# Patient Record
Sex: Male | Born: 1960 | Race: White | Hispanic: No | Marital: Single | Smoking: Former smoker
Health system: Southern US, Community
[De-identification: ages and names within clinical notes are randomized; demographics above are authoritative.]

## PROBLEM LIST (undated history)

## (undated) DIAGNOSIS — K274 Chronic or unspecified peptic ulcer, site unspecified, with hemorrhage: Secondary | ICD-10-CM

## (undated) DIAGNOSIS — Z972 Presence of dental prosthetic device (complete) (partial): Secondary | ICD-10-CM

## (undated) DIAGNOSIS — S129XXA Fracture of neck, unspecified, initial encounter: Secondary | ICD-10-CM

## (undated) HISTORY — PX: ESOPHAGOGASTRODUODENOSCOPY: SHX1529

---

## 2001-03-28 DIAGNOSIS — S129XXA Fracture of neck, unspecified, initial encounter: Secondary | ICD-10-CM

## 2001-03-28 HISTORY — DX: Fracture of neck, unspecified, initial encounter: S12.9XXA

## 2007-03-29 DIAGNOSIS — K274 Chronic or unspecified peptic ulcer, site unspecified, with hemorrhage: Secondary | ICD-10-CM

## 2007-03-29 DIAGNOSIS — K284 Chronic or unspecified gastrojejunal ulcer with hemorrhage: Secondary | ICD-10-CM

## 2007-03-29 HISTORY — DX: Chronic or unspecified peptic ulcer, site unspecified, with hemorrhage: K27.4

## 2007-03-29 HISTORY — DX: Chronic or unspecified gastrojejunal ulcer with hemorrhage: K28.4

## 2015-07-16 ENCOUNTER — Other Ambulatory Visit: Payer: Self-pay | Admitting: Nurse Practitioner

## 2015-07-16 DIAGNOSIS — R1032 Left lower quadrant pain: Secondary | ICD-10-CM

## 2015-07-16 DIAGNOSIS — R945 Abnormal results of liver function studies: Secondary | ICD-10-CM

## 2015-07-17 ENCOUNTER — Ambulatory Visit
Admission: RE | Admit: 2015-07-17 | Discharge: 2015-07-17 | Disposition: A | Discharge status: Home / Self Care | Payer: PRIVATE HEALTH INSURANCE | Source: Ambulatory Visit | Attending: Nurse Practitioner | Admitting: Nurse Practitioner

## 2015-07-17 ENCOUNTER — Encounter: Payer: Self-pay | Admitting: *Deleted

## 2015-07-17 DIAGNOSIS — R1032 Left lower quadrant pain: Secondary | ICD-10-CM

## 2015-07-17 DIAGNOSIS — K7689 Other specified diseases of liver: Secondary | ICD-10-CM | POA: Insufficient documentation

## 2015-07-17 DIAGNOSIS — R945 Abnormal results of liver function studies: Secondary | ICD-10-CM

## 2015-07-17 MED ORDER — IOHEXOL 350 MG/ML SOLN
125.0000 mL | Freq: Once | INTRAVENOUS | Status: AC | PRN
Start: 2015-07-17 — End: 2015-07-17
  Administered 2015-07-17: 125 mL via INTRAVENOUS

## 2015-07-20 NOTE — Discharge Instructions (Signed)

## 2015-07-21 ENCOUNTER — Ambulatory Visit: Payer: PRIVATE HEALTH INSURANCE | Admitting: Anesthesiology

## 2015-07-21 ENCOUNTER — Ambulatory Visit
Admission: RE | Admit: 2015-07-21 | Discharge: 2015-07-21 | Disposition: A | Discharge status: Home / Self Care | Payer: PRIVATE HEALTH INSURANCE | Source: Ambulatory Visit | Attending: Gastroenterology | Admitting: Gastroenterology

## 2015-07-21 ENCOUNTER — Encounter: Payer: Self-pay | Admitting: Anesthesiology

## 2015-07-21 ENCOUNTER — Encounter
Admission: RE | Disposition: A | Discharge status: Home / Self Care | Payer: Self-pay | Source: Ambulatory Visit | Attending: Gastroenterology

## 2015-07-21 DIAGNOSIS — R1032 Left lower quadrant pain: Secondary | ICD-10-CM | POA: Insufficient documentation

## 2015-07-21 DIAGNOSIS — Z8711 Personal history of peptic ulcer disease: Secondary | ICD-10-CM | POA: Insufficient documentation

## 2015-07-21 DIAGNOSIS — Z8379 Family history of other diseases of the digestive system: Secondary | ICD-10-CM | POA: Insufficient documentation

## 2015-07-21 DIAGNOSIS — K7689 Other specified diseases of liver: Secondary | ICD-10-CM | POA: Insufficient documentation

## 2015-07-21 DIAGNOSIS — Z8249 Family history of ischemic heart disease and other diseases of the circulatory system: Secondary | ICD-10-CM | POA: Insufficient documentation

## 2015-07-21 HISTORY — PX: COLONOSCOPY: SHX5424

## 2015-07-21 HISTORY — DX: Chronic or unspecified peptic ulcer, site unspecified, with hemorrhage: K27.4

## 2015-07-21 HISTORY — DX: Presence of dental prosthetic device (complete) (partial): Z97.2

## 2015-07-21 HISTORY — DX: Fracture of neck, unspecified, initial encounter: S12.9XXA

## 2015-07-21 SURGERY — COLONOSCOPY
Anesthesia: Monitor Anesthesia Care | Wound class: Contaminated

## 2015-07-21 MED ORDER — STERILE WATER FOR IRRIGATION IR SOLN
Status: DC | PRN
  Administered 2015-07-21: 13:00:00

## 2015-07-21 MED ORDER — PROPOFOL 10 MG/ML IV BOLUS
INTRAVENOUS | Status: DC | PRN
  Administered 2015-07-21 (×3): 20 mg via INTRAVENOUS
  Administered 2015-07-21 (×2): 30 mg via INTRAVENOUS
  Administered 2015-07-21: 70 mg via INTRAVENOUS

## 2015-07-21 MED ORDER — LIDOCAINE HCL (CARDIAC) 20 MG/ML IV SOLN
INTRAVENOUS | Status: DC | PRN
  Administered 2015-07-21: 40 mg via INTRAVENOUS

## 2015-07-21 MED ORDER — LACTATED RINGERS IV SOLN
INTRAVENOUS | Status: DC
  Administered 2015-07-21: 12:00:00 via INTRAVENOUS

## 2015-07-21 SURGICAL SUPPLY — 30 items

## 2015-07-21 NOTE — H&P (Signed)
  Date of Initial H&P: 07/16/2015  History reviewed, patient examined, no change in status, stable for surgery. 
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
## 2015-07-21 NOTE — Anesthesia Postprocedure Evaluation (Signed)
Anesthesia Post Note  Patient: Ernest Smith  Procedure(s) Performed: Procedure(s) (LRB): COLONOSCOPY (N/A)  Patient location during evaluation: PACU Anesthesia Type: MAC Level of consciousness: awake and alert Pain management: pain level controlled Vital Signs Assessment: post-procedure vital signs reviewed and stable Respiratory status: spontaneous breathing, nonlabored ventilation, respiratory function stable and patient connected to nasal cannula oxygen Cardiovascular status: stable and blood pressure returned to baseline Anesthetic complications: no    Ricquel Foulk

## 2015-07-21 NOTE — Transfer of Care (Signed)
Immediate Anesthesia Transfer of Care Note  Patient: Ernest Smith  Procedure(s) Performed: Procedure(s): COLONOSCOPY (N/A)  Patient Location: PACU  Anesthesia Type: MAC  Level of Consciousness: awake, alert  and patient cooperative  Airway and Oxygen Therapy: Patient Spontanous Breathing and Patient connected to supplemental oxygen  Post-op Assessment: Post-op Vital signs reviewed, Patient's Cardiovascular Status Stable, Respiratory Function Stable, Patent Airway and No signs of Nausea or vomiting  Post-op Vital Signs: Reviewed and stable  Complications: No apparent anesthesia complications

## 2015-07-21 NOTE — Op Note (Signed)
[facility] Patient Name: Ernest KellerRobert Smith Procedure Date: 07/21/2015 12:54 PM MRN: [id number] Account #: [id number] Date of Birth: 1960/08/28 Admit Type: Outpatient Age: 55 [facility] Gender: Male Note Status: Finalized Procedure:            Colonoscopy Indications:          Abdominal pain in the left lower quadrant Providers:            Ernest StandingPaul Y. Bluford Kaufmannh, MD Referring MD:         Ernest Smith (Referring MD) Medicines:            Monitored Anesthesia Care Complications:        No immediate complications. Procedure:            Pre-Anesthesia Assessment:                       - Prior to the procedure, a History and Physical was                        performed, and patient medications, allergies and                        sensitivities were reviewed. The patient's tolerance of                        previous anesthesia was reviewed.                       - The risks and benefits of the procedure and the                        sedation options and risks were discussed with the                        patient. All questions were answered and informed                        consent was obtained.                       - After reviewing the risks and benefits, the patient                        was deemed in satisfactory condition to undergo the                        procedure.                       After obtaining informed consent, the colonoscope was                        passed under direct vision. Throughout the procedure,                        the patient's blood pressure, pulse, and oxygen                        saturations were monitored continuously. The was  introduced through the anus and advanced to the the                        cecum, identified by appendiceal orifice and ileocecal                        valve. The colonoscopy was performed without                        difficulty. The patient tolerated the  procedure well.                        The quality of the bowel preparation was good. Findings:      The colon (entire examined portion) appeared normal. Impression:           - The entire examined colon is normal.                       - No specimens collected. Recommendation:       - Discharge patient to home.                       - Repeat colonoscopy in 10 years for surveillance.                       - The findings and recommendations were discussed with                        the patient. Procedure Code(s):    --- Professional ---                       [phone], Colonoscopy, flexible; diagnostic, including                        collection of specimen(s) by brushing or washing, when                        performed (separate procedure) Diagnosis Code(s):    --- Professional ---                       R10.32, Left lower quadrant pain CPT copyright 2016 [employer]. All rights reserved. The codes documented in this report are preliminary and upon coder review may  be revised to meet current compliance requirements. Ernest Cullens, MD 07/21/2015 1:13:50 PM This report has been signed electronically. Number of Addenda: 0 Note Initiated On: 07/21/2015 12:54 PM Scope Withdrawal Time: 0 hours 6 minutes 3 seconds  Total Procedure Duration: 0 hours 7 minutes 57 seconds       [facility]

## 2015-07-21 NOTE — Anesthesia Procedure Notes (Signed)
Procedure Name: MAC Performed by: Jerell Demery Pre-anesthesia Checklist: Patient identified, Emergency Drugs available, Suction available, Timeout performed and Patient being monitored Patient Re-evaluated:Patient Re-evaluated prior to inductionOxygen Delivery Method: Nasal cannula Placement Confirmation: positive ETCO2       

## 2016-10-09 IMAGING — CT CT ABD-PELV W/ CM
2 of 5 series · 17 of 46 positions shown, 19 images · IV contrast (omnipaque)
Comparison: none

CLINICAL DATA: Three-month history of left lower lobe quadrant
abdominal pain.

EXAM:
CT ABDOMEN AND PELVIS WITH CONTRAST
TECHNIQUE: Multidetector CT imaging of the abdomen and pelvis was performed
using the standard protocol following bolus administration of
intravenous contrast.
CONTRAST:  125mL OMNIPAQUE IOHEXOL 350 MG/ML SOLN

[Series 2: axial soft tissue · axial · 0.69mm/px · z∈[-764,-334]mm · 14 of 96 slices shown, 16 images]
[im 5/96  soft-tissue]
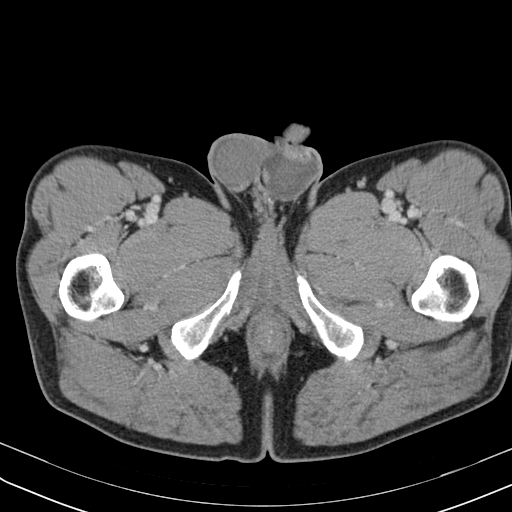
[im 5/96  bone]
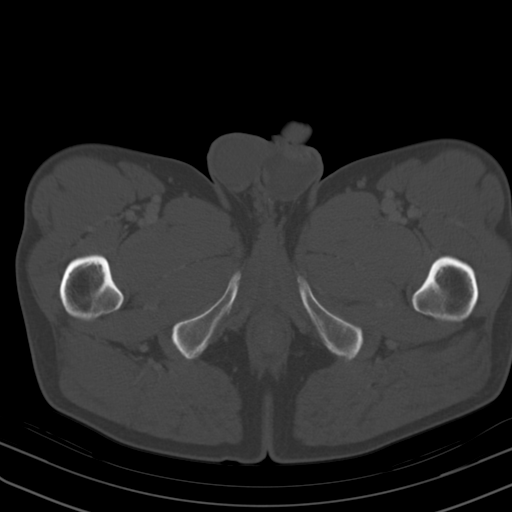
[im 15/96  soft-tissue]
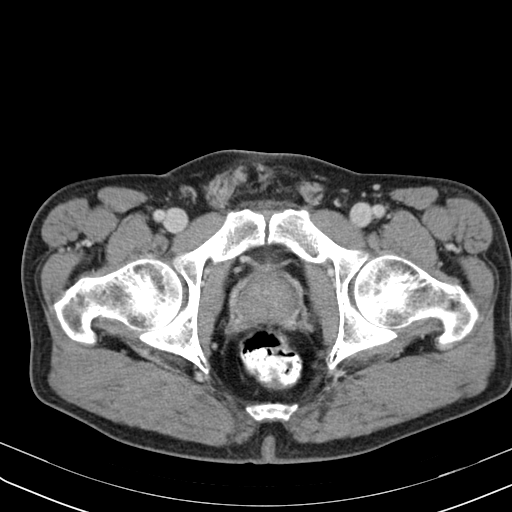
[im 20/96  soft-tissue]
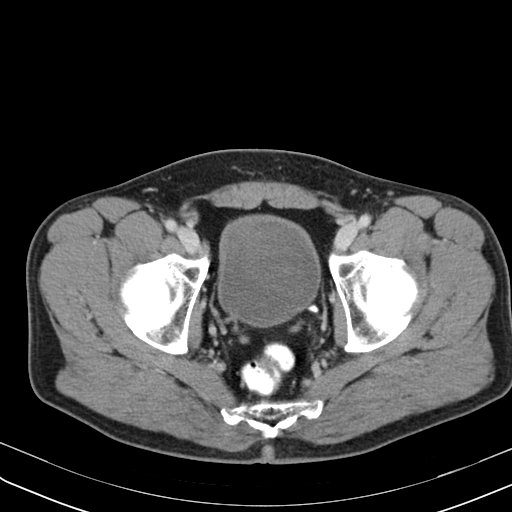
[im 24/96  soft-tissue]
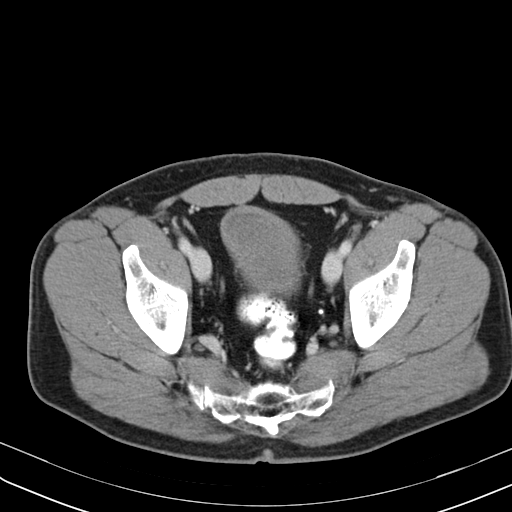
[im 34/96  soft-tissue]
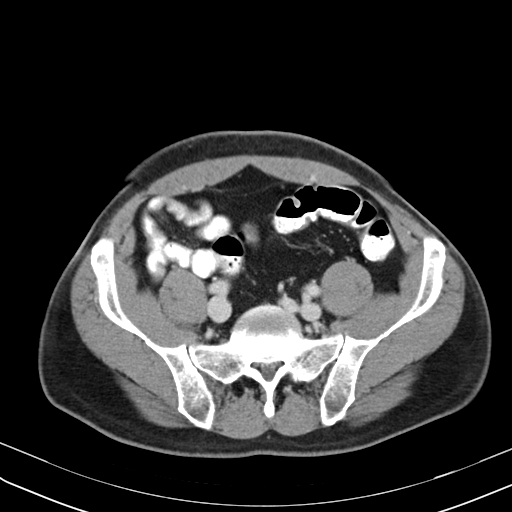
[im 39/96  soft-tissue]
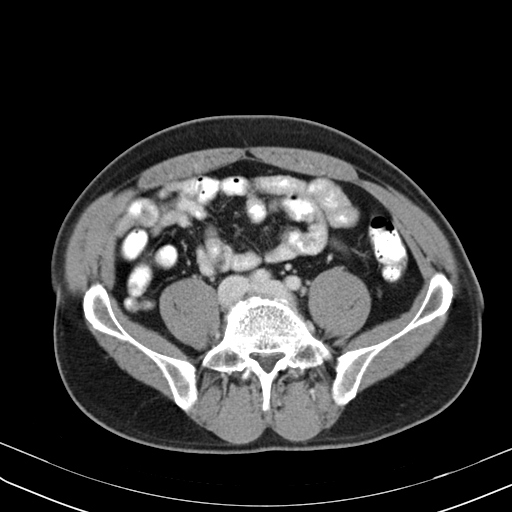
[im 43/96  soft-tissue]
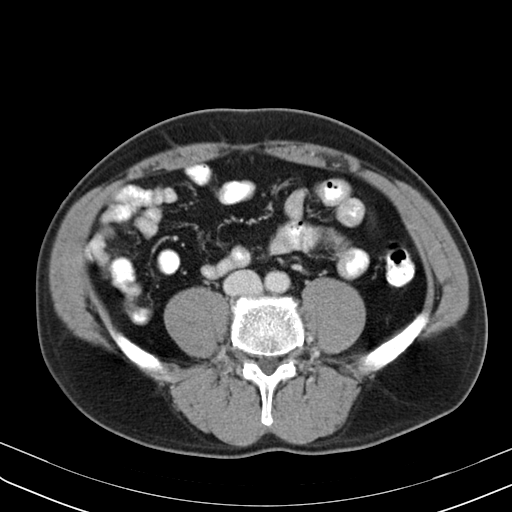
[im 53/96  soft-tissue]
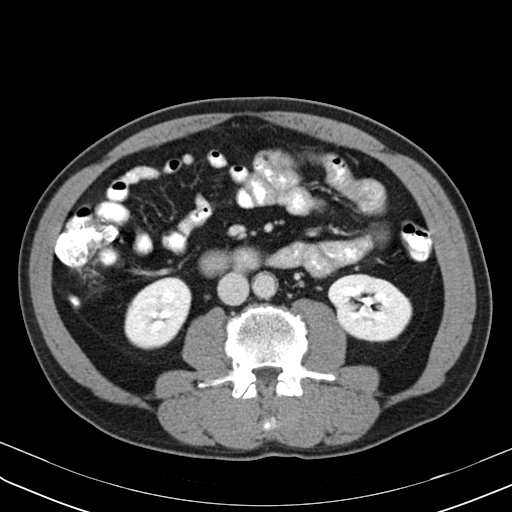
[im 58/96  soft-tissue]
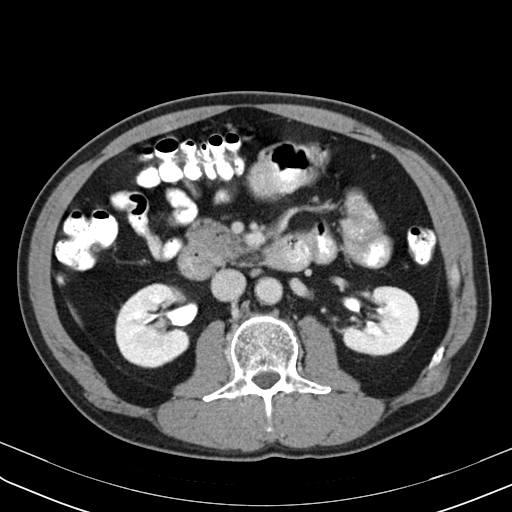
[im 58/96  bone]
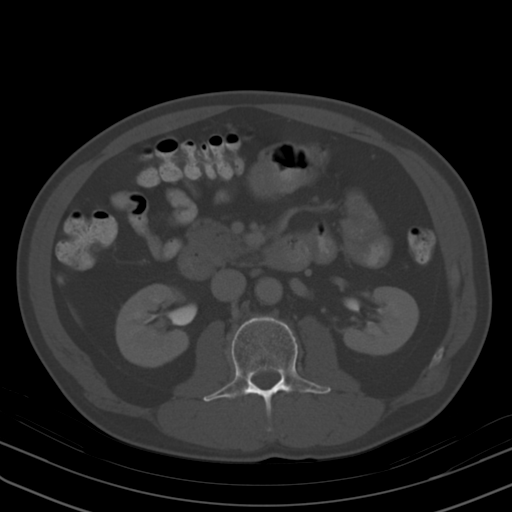
[im 62/96  soft-tissue]
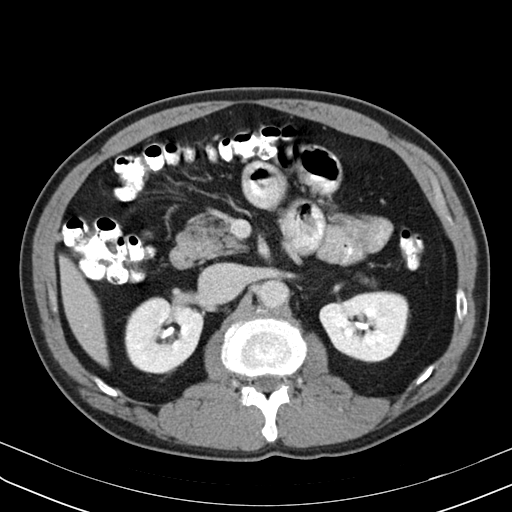
[im 72/96  soft-tissue]
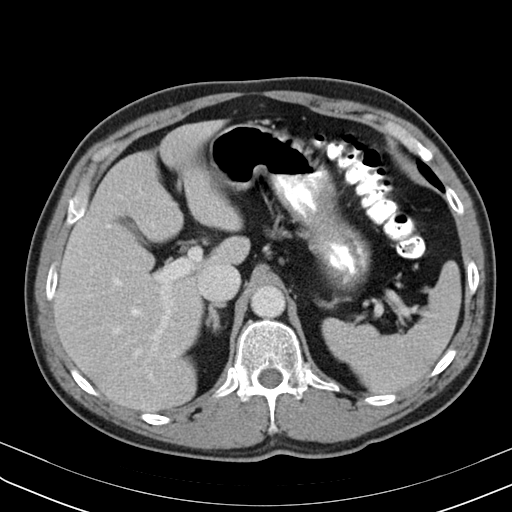
[im 77/96  soft-tissue]
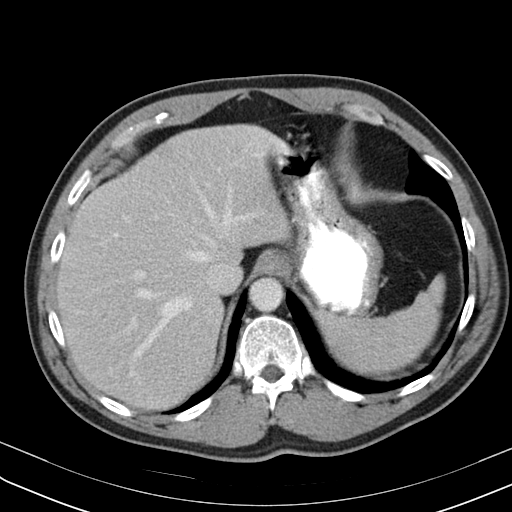
[im 81/96  soft-tissue]
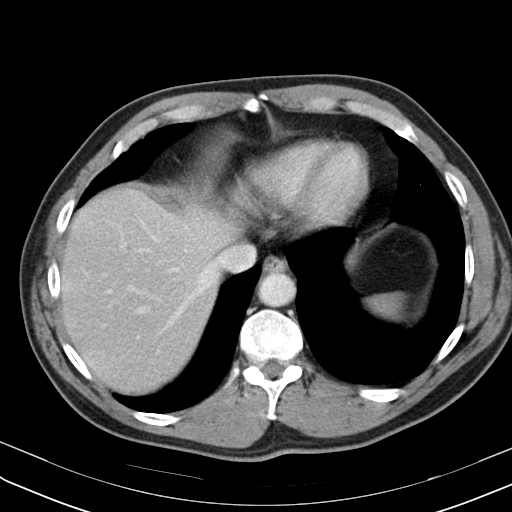
[im 91/96  soft-tissue]
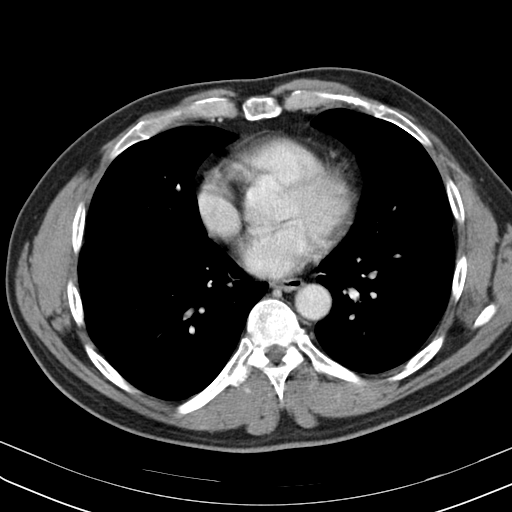

[Series 602: coronal · coronal · 0.93mm/px · 3 of 88 slices shown]
[im 30/88  soft-tissue]
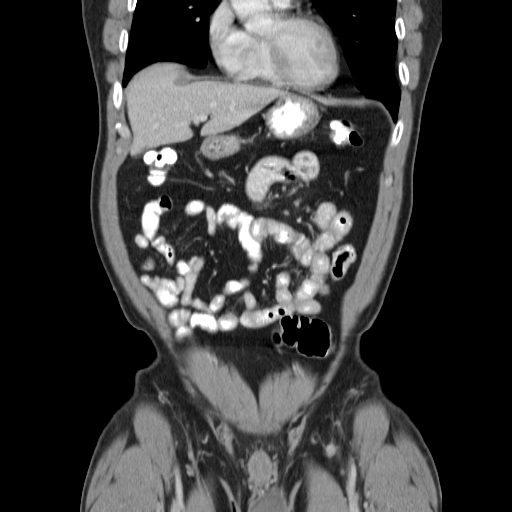
[im 39/88  soft-tissue]
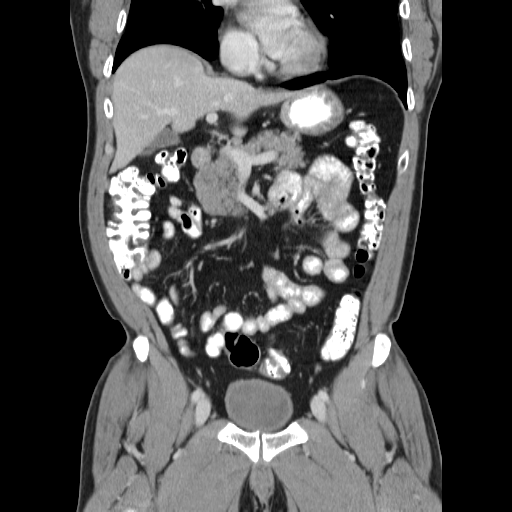
[im 49/88  soft-tissue]
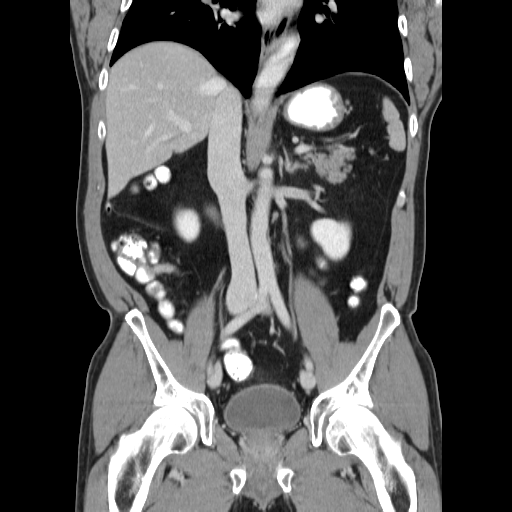

[17 of 46 positions shown; findings below may reference images not displayed]

FINDINGS: Lower chest: The lung bases are clear of acute process. No pleural
effusion or pulmonary lesions. The heart is normal in size. No
pericardial effusion. The distal esophagus and aorta are
unremarkable.

Hepatobiliary: No focal hepatic lesions or intrahepatic biliary
dilatation. The gallbladder is normal. No common bile duct
dilatation.

Pancreas: No mass, inflammation or ductal dilatation.

Spleen: Normal size.  No focal lesions.

Adrenals/Urinary Tract: The adrenal glands and kidneys are normal.
No ureteral or bladder calculi.

Stomach/Bowel: The stomach, duodenum, small bowel and colon are
unremarkable. No inflammatory changes, mass lesions or obstructive
findings. The terminal ileum is normal. The appendix is normal.

Vascular/Lymphatic: The aorta and branch vessels are normal. Minimal
distal aortic atherosclerotic calcifications. The major venous
structures are patent. No mesenteric or retroperitoneal mass or
adenopathy.

Other: No pelvic mass or adenopathy. No free pelvic fluid
collections. Set the bladder, prostate gland and seminal vesicles
are unremarkable. A small left scrotal hydrocele is noted
incidentally. No inguinal mass or adenopathy. There is no abdominal
wall hernia.

Musculoskeletal: No significant bony findings.
IMPRESSION: No acute abdominal/ pelvic findings, mass lesions or adenopathy.
